# Patient Record
Sex: Female | Born: 1980 | Race: White | Hispanic: No | Marital: Married | Smoking: Never smoker
Health system: Southern US, Community
[De-identification: ages and names within clinical notes are randomized; demographics above are authoritative.]

---

## 2009-02-23 ENCOUNTER — Encounter: Admission: RE | Admit: 2009-02-23 | Discharge: 2009-02-23 | Payer: Self-pay | Admitting: Obstetrics and Gynecology

## 2009-07-07 ENCOUNTER — Inpatient Hospital Stay (HOSPITAL_COMMUNITY): Admission: AD | Admit: 2009-07-07 | Discharge: 2009-07-07 | Payer: Self-pay | Admitting: Obstetrics and Gynecology

## 2009-07-08 ENCOUNTER — Inpatient Hospital Stay (HOSPITAL_COMMUNITY): Admission: AD | Admit: 2009-07-08 | Discharge: 2009-07-10 | Payer: Self-pay | Admitting: Obstetrics and Gynecology

## 2010-12-08 LAB — CBC
HCT: 36.1 % (ref 36.0–46.0)
HCT: 40.1 % (ref 36.0–46.0)
Hemoglobin: 12.3 g/dL (ref 12.0–15.0)
MCHC: 34 g/dL (ref 30.0–36.0)
MCV: 90 fL (ref 78.0–100.0)
RBC: 4.01 MIL/uL (ref 3.87–5.11)
RBC: 4.46 MIL/uL (ref 3.87–5.11)
RDW: 13.4 % (ref 11.5–15.5)
WBC: 16.1 10*3/uL — ABNORMAL HIGH (ref 4.0–10.5)

## 2010-12-08 LAB — URINALYSIS, ROUTINE W REFLEX MICROSCOPIC
Glucose, UA: NEGATIVE mg/dL
Hgb urine dipstick: NEGATIVE
Specific Gravity, Urine: <1.005 — ABNORMAL LOW (ref 1.005–1.030)
pH: 7 (ref 5.0–8.0)

## 2011-03-10 ENCOUNTER — Inpatient Hospital Stay (HOSPITAL_COMMUNITY)
Admission: AD | Admit: 2011-03-10 | Discharge: 2011-03-11 | DRG: 775 | Disposition: A | Discharge status: Home / Self Care | Payer: PRIVATE HEALTH INSURANCE | Source: Ambulatory Visit | Attending: Obstetrics and Gynecology | Admitting: Obstetrics and Gynecology

## 2011-03-10 LAB — CBC
Hemoglobin: 12.6 g/dL (ref 12.0–15.0)
MCH: 29.2 pg (ref 26.0–34.0)
MCHC: 33.4 g/dL (ref 30.0–36.0)
MCV: 87.3 fL (ref 78.0–100.0)
Platelets: 131 10*3/uL — ABNORMAL LOW (ref 150–400)

## 2011-03-10 LAB — RPR: RPR Ser Ql: NONREACTIVE

## 2011-03-11 LAB — CBC
Hemoglobin: 12.5 g/dL (ref 12.0–15.0)
MCH: 28.9 pg (ref 26.0–34.0)
MCHC: 32.9 g/dL (ref 30.0–36.0)
MCV: 88 fL (ref 78.0–100.0)
RBC: 4.32 MIL/uL (ref 3.87–5.11)

## 2011-03-15 ENCOUNTER — Inpatient Hospital Stay (HOSPITAL_COMMUNITY): Admission: AD | Admit: 2011-03-15 | Payer: Self-pay | Source: Ambulatory Visit | Admitting: Obstetrics and Gynecology

## 2019-06-23 ENCOUNTER — Ambulatory Visit (INDEPENDENT_AMBULATORY_CARE_PROVIDER_SITE_OTHER): Payer: 59

## 2019-06-23 ENCOUNTER — Encounter: Payer: Self-pay | Admitting: Emergency Medicine

## 2019-06-23 ENCOUNTER — Ambulatory Visit
Admission: EM | Admit: 2019-06-23 | Discharge: 2019-06-23 | Disposition: A | Discharge status: Home / Self Care | Payer: 59

## 2019-06-23 ENCOUNTER — Other Ambulatory Visit: Payer: Self-pay

## 2019-06-23 DIAGNOSIS — M79671 Pain in right foot: Secondary | ICD-10-CM

## 2019-06-23 DIAGNOSIS — M25571 Pain in right ankle and joints of right foot: Secondary | ICD-10-CM

## 2019-06-23 MED ORDER — DICLOFENAC SODIUM 1 % TD GEL: 2.0000 g | Freq: Four times a day (QID) | TRANSDERMAL | 0 refills | Status: AC

## 2019-06-23 NOTE — ED Notes (Signed)
Patient able to ambulate independently  

## 2019-06-23 NOTE — Discharge Instructions (Signed)
As discussed, may have a fracture along the right foot.  However, given this is also where bruising is, unsure if pain is due to fracture or bruising.  Start Voltaren gel as directed, ice compress, CAM Walker, crutches for the next week.  If symptoms significantly improve, less suspicious for fracture, and can continue symptomatic treatment.  If symptoms continue without much improvement, or worsening, more suspicious for fracture, and follow-up with orthopedic for further evaluation needed.  If numbness/tingling does not resolve to your foot/toe, follow-up with orthopedic for further evaluation.

## 2019-06-23 NOTE — ED Provider Notes (Signed)
[facility]    CSN: [id number] Arrival date & time: 06/23/19  0818      History   Chief Complaint Chief Complaint  Patient presents with  . Ankle Pain    HPI Victoria Bentley is a 38 y.o. female.   38 year old female comes in for evaluation of right foot pain after injury 3 days ago.  States she slipped and fell, hitting right foot on a banister.  She feels that she was still able to ambulate immediately after incident.  States soreness/pain got worse throughout the day, with swelling and contusion.  States was unable to bear weight the next day, had and has been using crutches since.  She has been trying to take ibuprofen for pain and inflammation, but discontinued due to stomach irritation.  Has intermittent numbness and tingling to the toes, more associated with lack of movement.  History of fractures to the right foot/ankle pain. Denies history of surgery to the right foot.      History reviewed. No pertinent past medical history.  There are no active problems to display for this patient.   History reviewed. No pertinent surgical history.  OB History   No obstetric history on file.      Home Medications    Prior to Admission medications   Medication Sig Start Date End Date Taking? Authorizing Provider  ibuprofen (ADVIL) 600 MG tablet Take 600 mg by mouth every 6 (six) hours as needed.   Yes [provider]  diclofenac sodium (VOLTAREN) 1 % GEL Apply 2 g topically 4 (four) times daily. 06/23/19   Ok Edwards, PA-C    Family History Family History  Problem Relation Age of Onset  . Breast cancer Mother   . Hypertension Mother     Social History Social History   Tobacco Use  . Smoking status: Never Smoker  . Smokeless tobacco: Never Used  Substance Use Topics  . Alcohol use: Yes    Comment: socially  . Drug use: Never     Allergies   Patient has no known allergies.   Review of Systems Review of Systems  Reason unable to perform  ROS: See HPI as above.     Physical Exam Triage Vital Signs ED Triage Vitals  Enc Vitals Group     BP 06/23/19 0828 (!) 143/97     Pulse Rate 06/23/19 0828 (!) 102     Resp 06/23/19 0828 16     Temp 06/23/19 0828 98.7 F (37.1 C)     Temp Source 06/23/19 0828 Oral     SpO2 06/23/19 0828 96 %     Weight --      Height --      Head Circumference --      Peak Flow --      Pain Score 06/23/19 0831 1     Pain Loc --      Pain Edu? --      Excl. in [place]? --    No data found.  Updated Vital Signs BP (!) 143/97 (BP Location: Left Arm)   Pulse (!) 102   Temp 98.7 F (37.1 C) (Oral)   Resp 16   LMP 06/21/2019   SpO2 96%   Physical Exam Constitutional:      General: She is not in acute distress.    Appearance: She is well-developed. She is not diaphoretic.  HENT:     Head: Normocephalic and atraumatic.  Eyes:     Conjunctiva/sclera: Conjunctivae normal.  Pupils: Pupils are equal, round, and reactive to light.  Pulmonary:     Effort: Pulmonary effort is normal. No respiratory distress.  Musculoskeletal:     Comments: Swelling and contusion to the along medial and lateral right foot. No warmth, erythema. Tenderness to palpation lateral right foot, proximal 4th/5th MTP. Decreased flexion and lateral rotation of the ankle. Strength deferred. Sensation intact and equal bilaterally. Pedal pulse 2+  Neurological:     Mental Status: She is alert and oriented to person, place, and time.    UC Treatments / Results  Labs (all labs ordered are listed, but only abnormal results are displayed) Labs Reviewed - No data to display  EKG   Radiology Dg Ankle Complete Right  Result Date: 06/23/2019 CLINICAL DATA:  Right ankle pain and swelling after falling 3 days ago. EXAM: RIGHT ANKLE - COMPLETE 3+ VIEW COMPARISON:  None. FINDINGS: There is a peculiar obliquely oriented lucency involving the midfoot, seen only on the provided lateral radiograph, potentially artifactual due to  obliquity though conceivably a midfoot fracture could have a similar appearance. Otherwise, no fracture or dislocation. Joint spaces are preserved. Ankle mortise is preserved. No ankle joint effusion. Regional soft tissues appear normal. No radiopaque foreign body. No plantar calcaneal spur. IMPRESSION: 1. Obliquely oriented lucency involving the midfoot, seen only on the provided lateral radiograph, potentially artifactual due to obliquity though conceivably a midfoot fracture could have a similar appearance. Correlation for point tenderness at this location is advised. Further evaluation with dedicated radiographs of the right foot could be performed as indicated. 2. Otherwise, no explanation for patient's right ankle pain and swelling. Electronically Signed   By: Simonne Come M.D.   On: 06/23/2019 08:50    Procedures Procedures (including critical care time)  Medications Ordered in UC Medications - No data to display  Initial Impression / Assessment and Plan / UC Course  I have reviewed the triage vital signs and the nursing notes.  Pertinent labs & imaging results that were available during my care of the patient were reviewed by me and considered in my medical decision making (see chart for details).    Discussed x-ray results with patient.  Unable to fully assess point tenderness given overlapping contusion that may be causing pain.  Discussed adding right foot x-ray versus symptomatic treatment with follow-up with orthopedic.  Patient would like to defer right foot x-ray at this time.  Will provide Voltaren gel, CAM Walker.  Patient to continue to use crutches at this time.  Ice compress.  Discussed if symptoms not improving, to follow-up with orthopedic for further evaluation and management needed.  Return precautions given.  Patient expresses understanding and agrees to plan.  Final Clinical Impressions(s) / UC Diagnoses   Final diagnoses:  Right foot pain  Acute right ankle pain   ED  Prescriptions    Medication Sig Dispense Auth. Provider   diclofenac sodium (VOLTAREN) 1 % GEL Apply 2 g topically 4 (four) times daily. 50 g Belinda Fisher, PA-C     PDMP not reviewed this encounter.   Belinda Fisher, PA-C 06/23/19 0930

## 2019-06-23 NOTE — ED Triage Notes (Addendum)
Patient presents to [facility] for assessment after a slip and fall on 10/15 and slide her foot into the banister.  Patient had been drinking and did not realize how badly it was bruised and hurting the next.  Hx of breaks to that same ankle in the past (x2).  Patient presents with crutches, states weight bearing is excruciating.

## 2019-10-02 ENCOUNTER — Other Ambulatory Visit: Payer: Self-pay | Admitting: Obstetrics and Gynecology

## 2019-10-02 DIAGNOSIS — R221 Localized swelling, mass and lump, neck: Secondary | ICD-10-CM

## 2019-10-03 ENCOUNTER — Ambulatory Visit
Admission: RE | Admit: 2019-10-03 | Discharge: 2019-10-03 | Disposition: A | Discharge status: Home / Self Care | Payer: 59 | Source: Ambulatory Visit | Attending: Obstetrics and Gynecology | Admitting: Obstetrics and Gynecology

## 2019-10-03 ENCOUNTER — Encounter (INDEPENDENT_AMBULATORY_CARE_PROVIDER_SITE_OTHER): Payer: Self-pay

## 2019-10-03 DIAGNOSIS — R221 Localized swelling, mass and lump, neck: Secondary | ICD-10-CM

## 2020-11-21 IMAGING — DX DG ANKLE COMPLETE 3+V*R*
3 series · 3 of 3 positions shown · non-contrast
Comparison: None.

CLINICAL DATA: Right ankle pain and swelling after falling 3 days
ago.

EXAM:
RIGHT ANKLE - COMPLETE 3+ VIEW

[ankle ap]
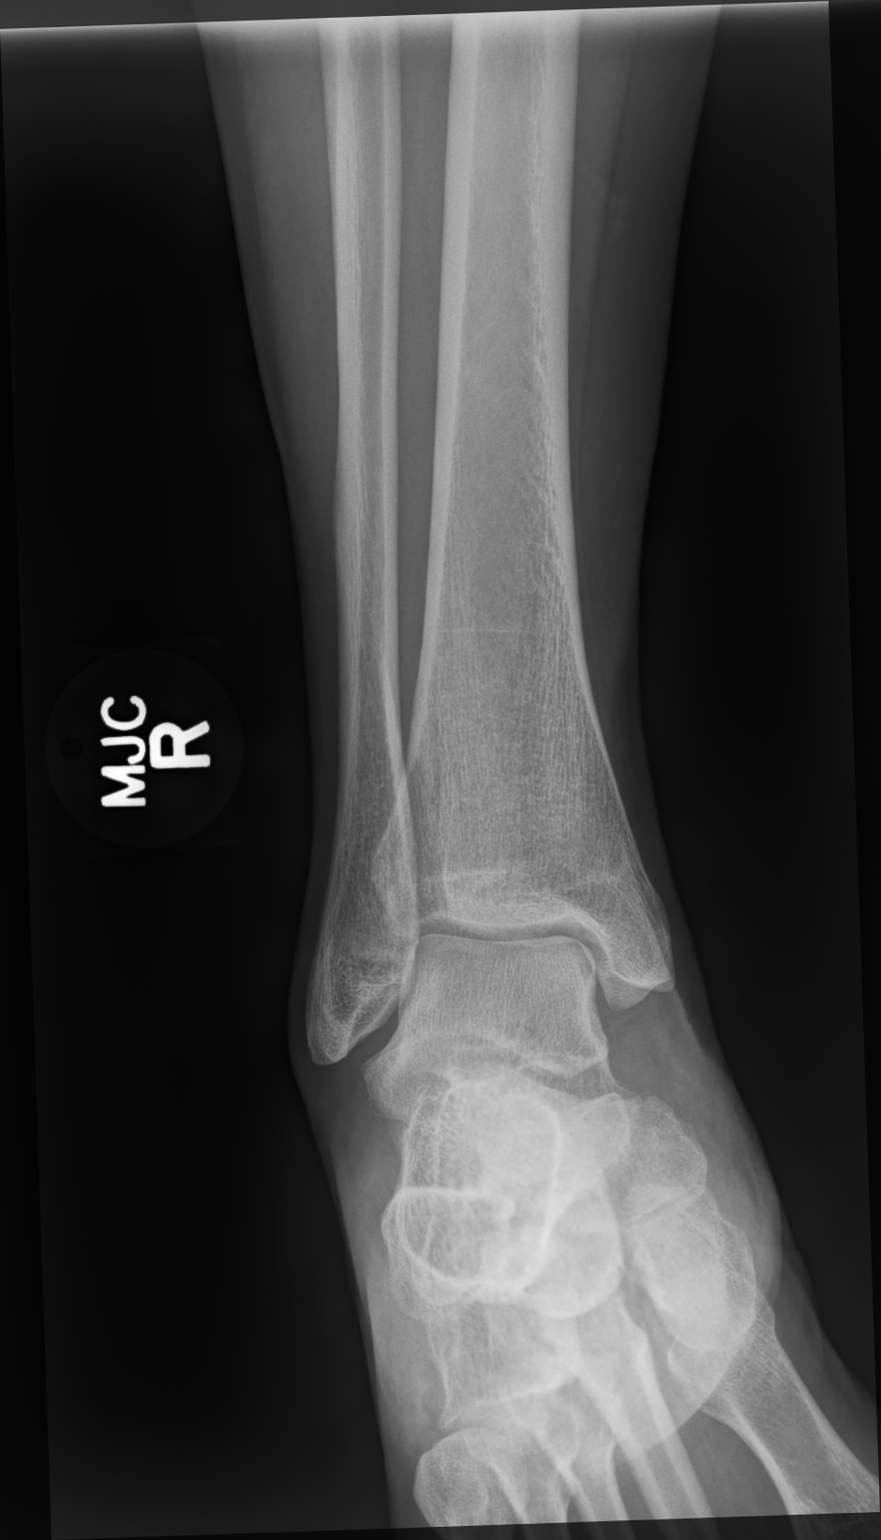

[ankle medial oblique]
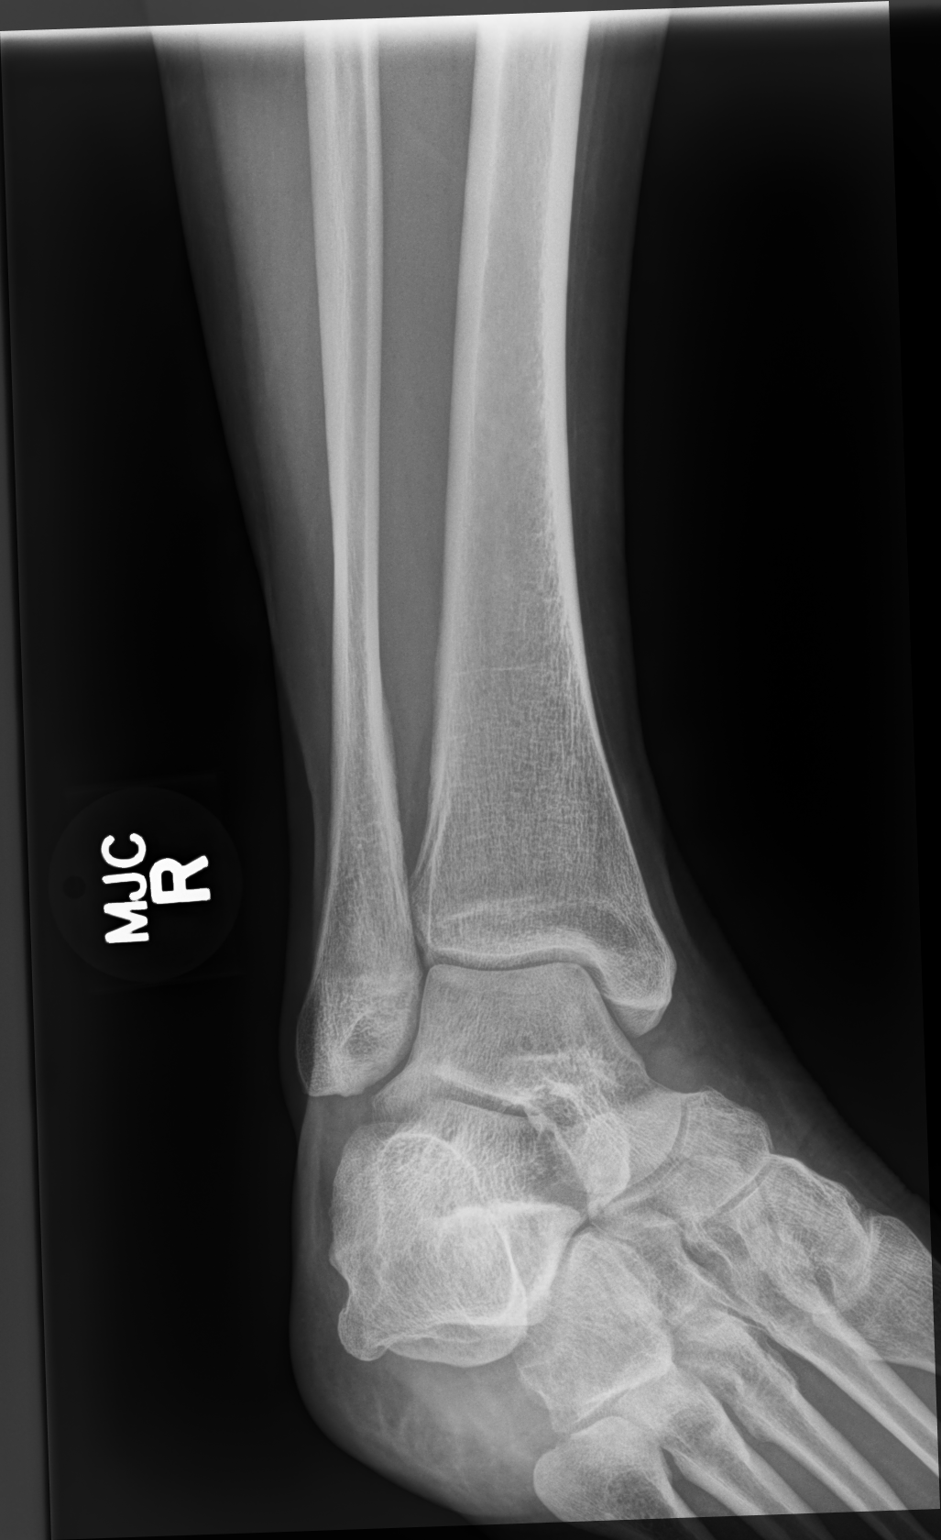

[ankle lat]
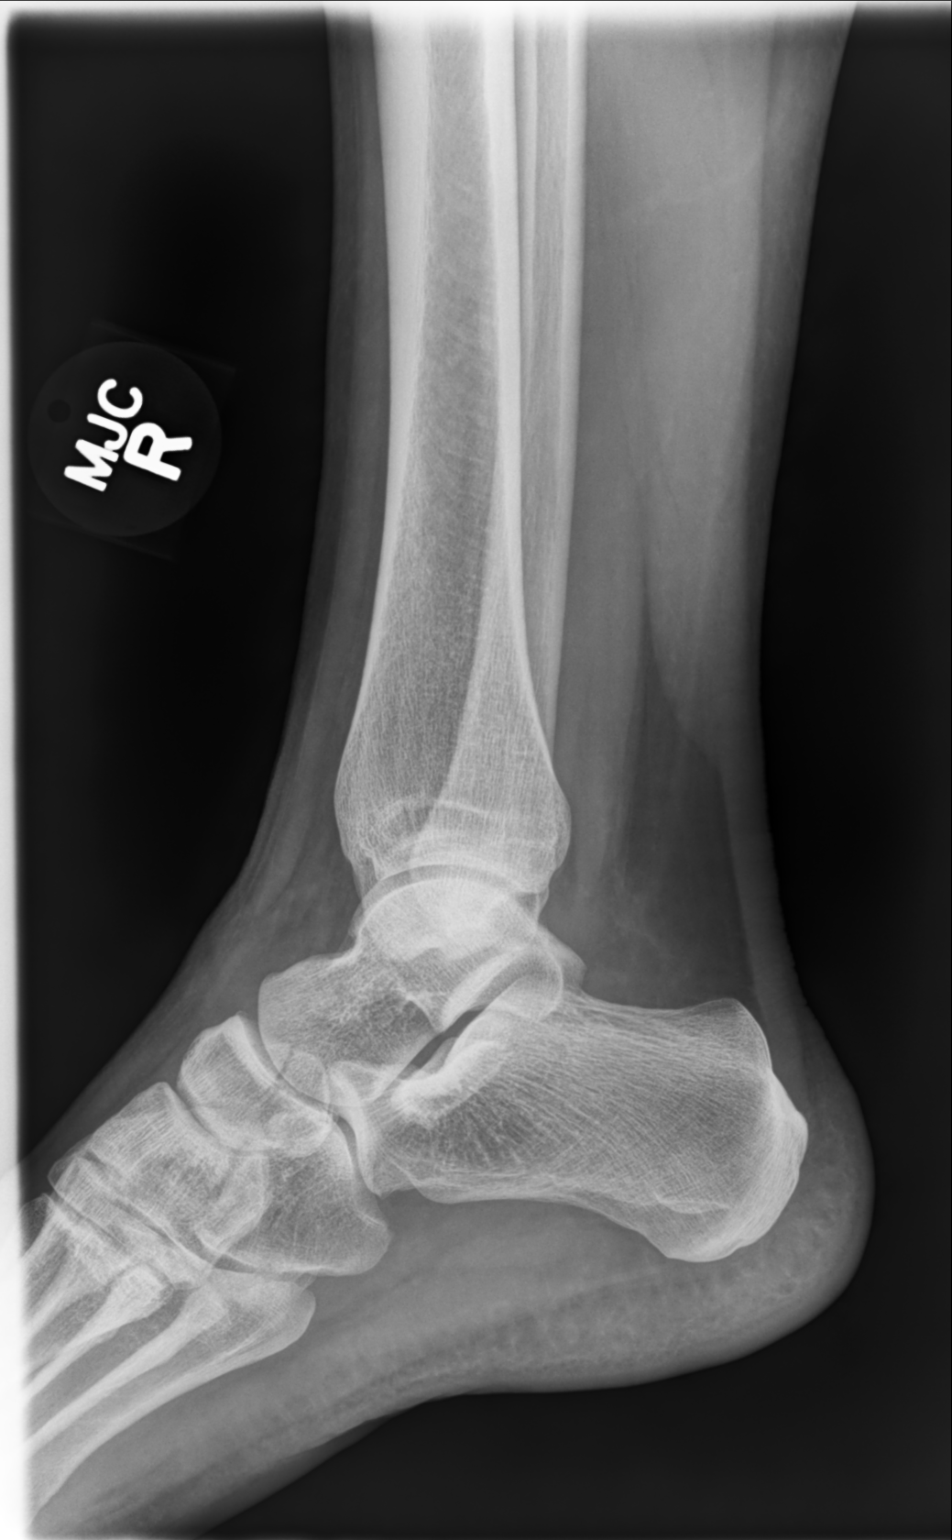

[3 of 3 positions shown; findings below may reference images not displayed]

FINDINGS: There is a peculiar obliquely oriented lucency involving the
midfoot, seen only on the provided lateral radiograph, potentially
artifactual due to obliquity though conceivably a midfoot fracture
could have a similar appearance.

Otherwise, no fracture or dislocation. Joint spaces are preserved.
Ankle mortise is preserved. No ankle joint effusion. Regional soft
tissues appear normal. No radiopaque foreign body. No plantar
calcaneal spur.
IMPRESSION: 1. Obliquely oriented lucency involving the midfoot, seen only on
the provided lateral radiograph, potentially artifactual due to
obliquity though conceivably a midfoot fracture could have a similar
appearance. Correlation for point tenderness at this location is
advised. Further evaluation with dedicated radiographs of the right
foot could be performed as indicated.
2. Otherwise, no explanation for patient's right ankle pain and
swelling.

## 2021-03-03 IMAGING — US US SOFT TISSUE HEAD/NECK
1 series · 14 of 16 positions shown · non-contrast
Comparison: None.

CLINICAL DATA: 38-year-old female with a history of swelling in the
supraclavicular region.

EXAM:
ULTRASOUND OF HEAD/NECK SOFT TISSUES
TECHNIQUE: Ultrasound examination of the head and neck soft tissues was
performed in the area of clinical concern.

[Series 1: us soft tissue head/neck · 0.06mm/px · 16 acquisitions, 14 frames shown]
[im 1/16]
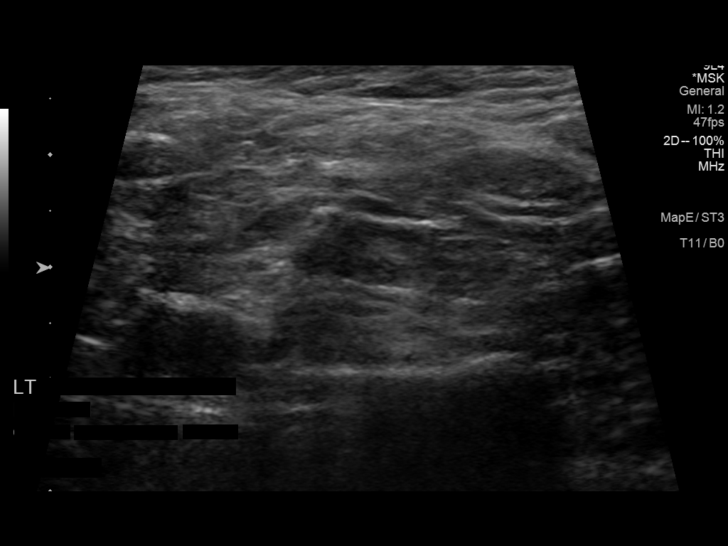
[im 2/16]
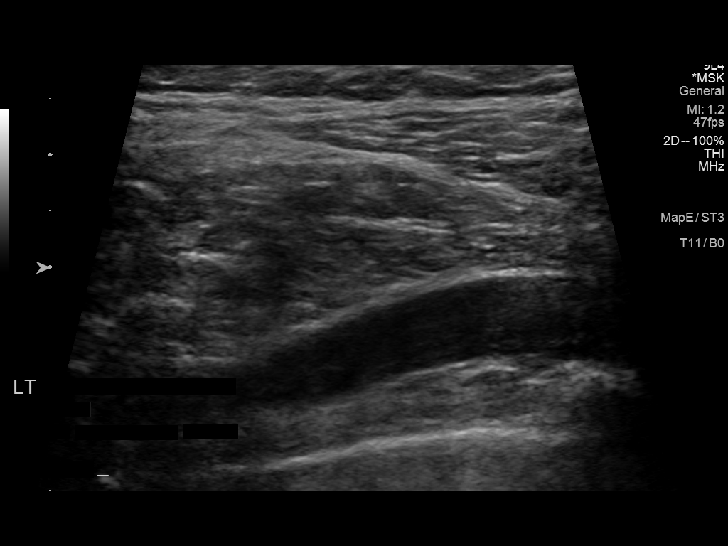
[im 3/16]
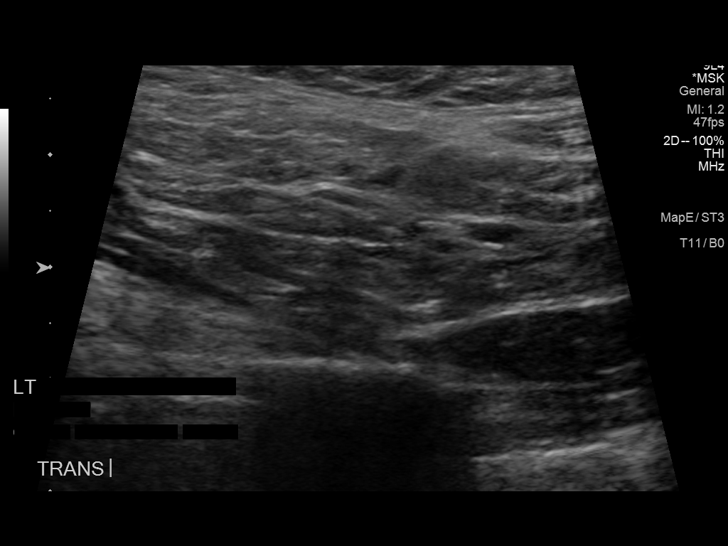
[im 5/16]
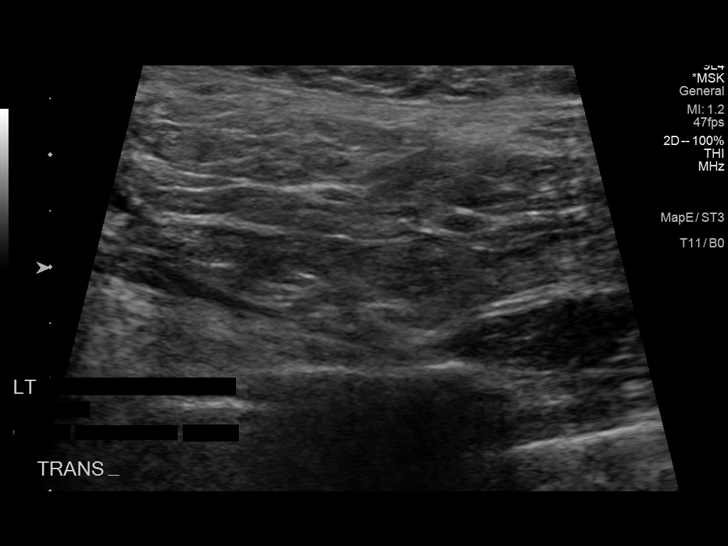
[im 6/16]
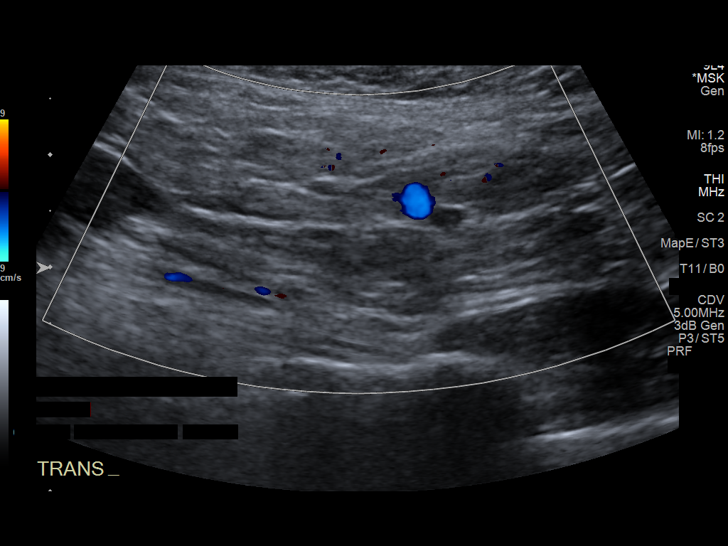
[im 7/16]
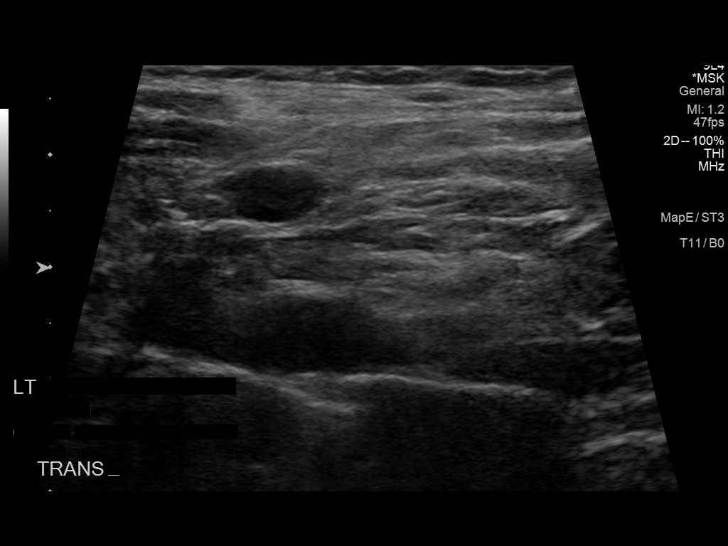
[im 8/16]
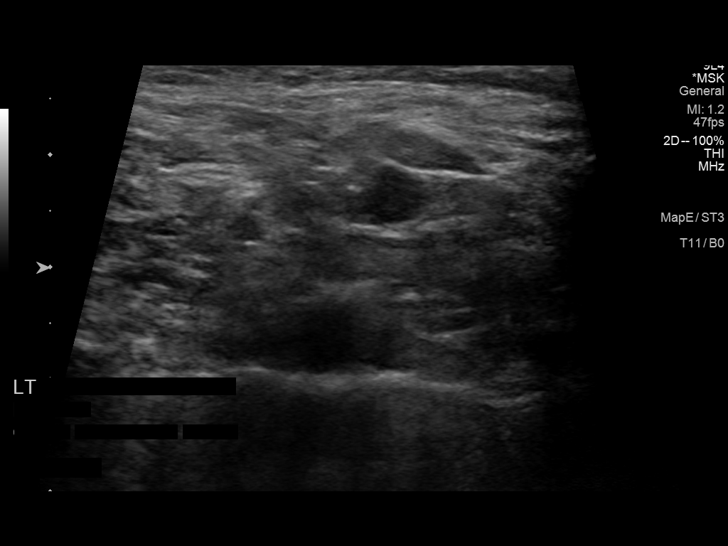
[im 9/16]
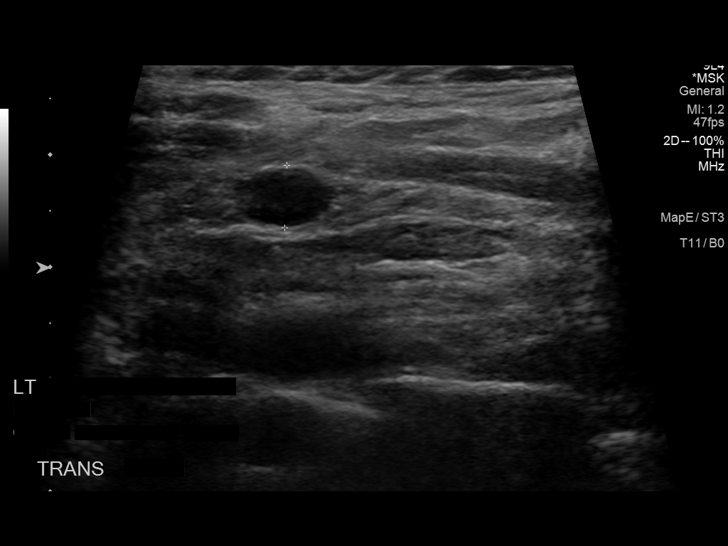
[im 10/16]
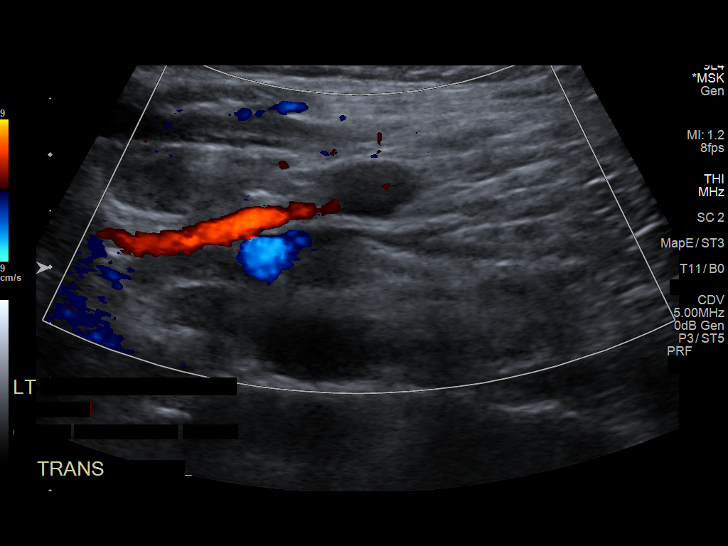
[im 11/16]
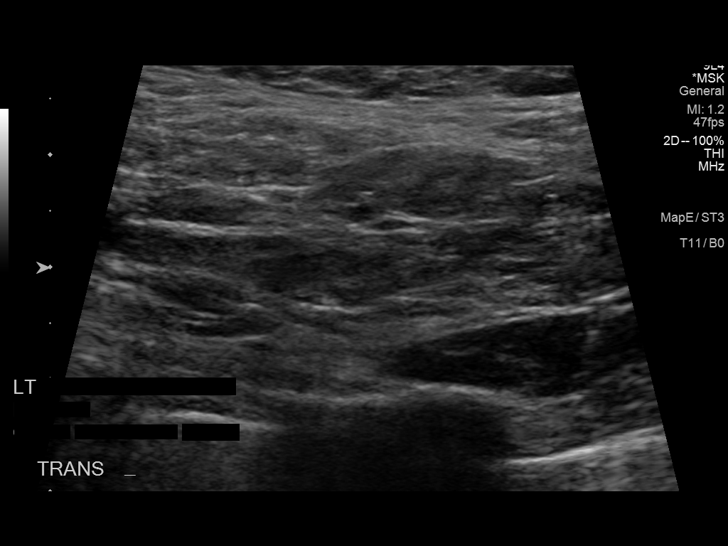
[im 13/16]
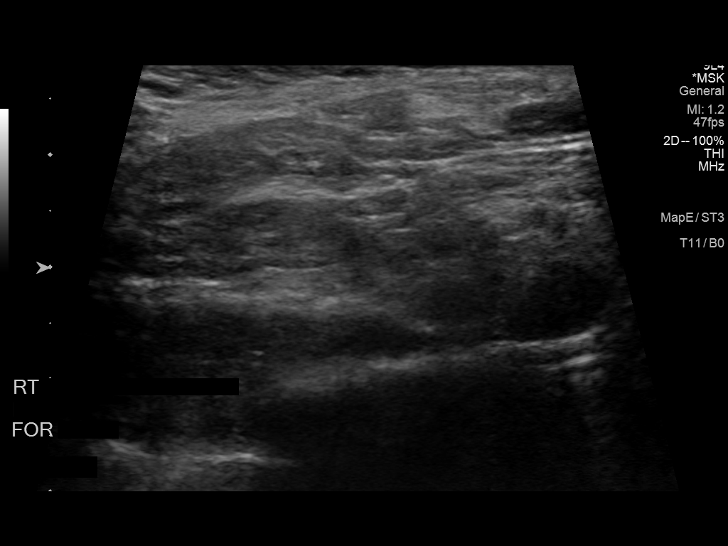
[im 14/16]
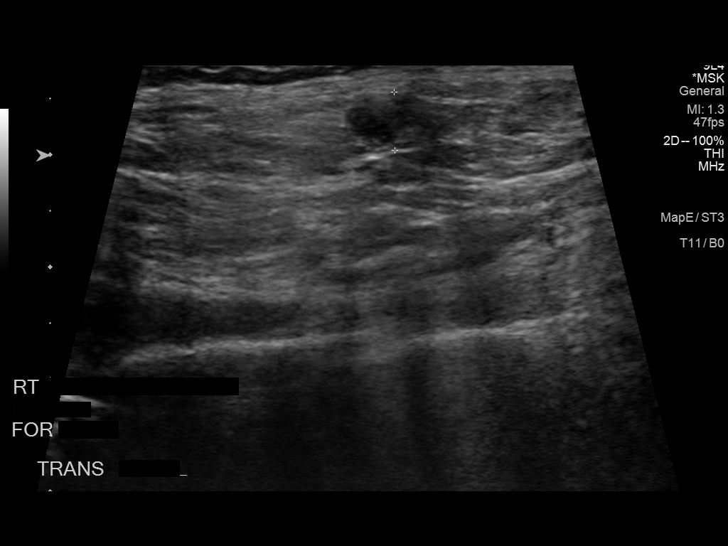
[im 15/16]
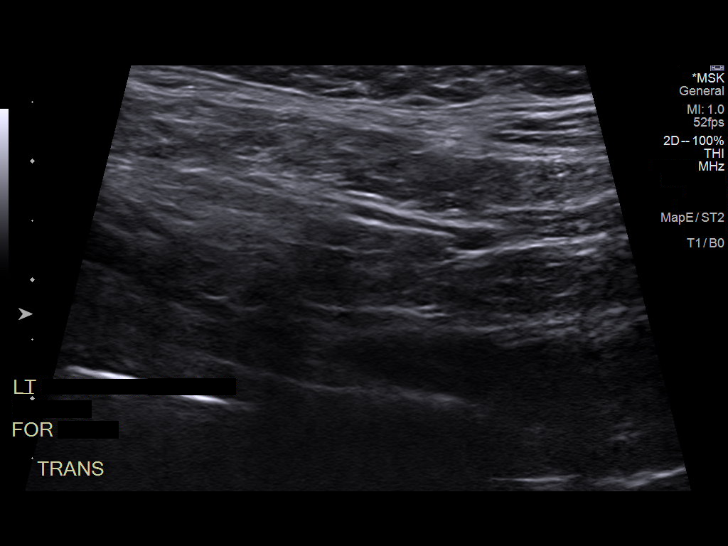
[im 16/16]
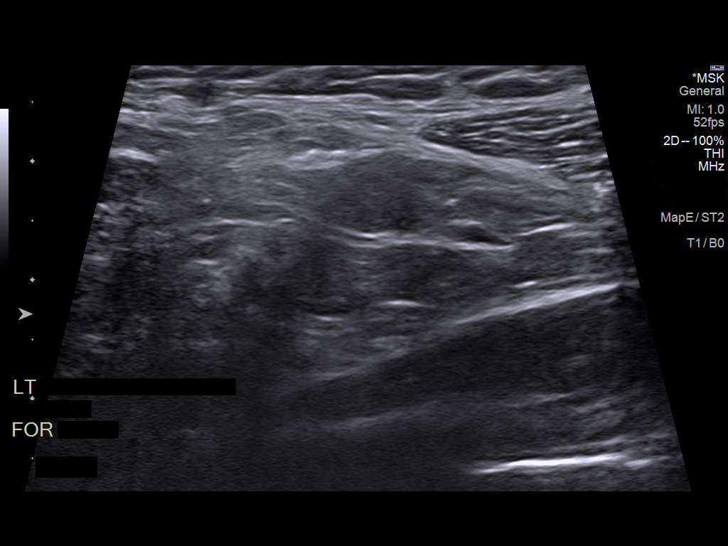

[14 of 16 positions shown; findings below may reference images not displayed]

FINDINGS: Grayscale and color duplex ultrasound performed in the region of
clinical concern.

No focal fluid.

Typical appearing lymph node present.

No soft tissue lesion.
IMPRESSION: Relatively unremarkable sonographic survey in the region of clinical
concern, with small lymph node present, potentially reactive though
nonspecific.
